# Patient Record
Sex: Female | Born: 2000 | Race: White | Hispanic: No | Marital: Single | Smoking: Never smoker
Health system: Southern US, Community
[De-identification: ages and names within clinical notes are randomized; demographics above are authoritative.]

## PROBLEM LIST (undated history)

## (undated) DIAGNOSIS — G43909 Migraine, unspecified, not intractable, without status migrainosus: Secondary | ICD-10-CM

## (undated) HISTORY — PX: ANTERIOR CRUCIATE LIGAMENT REPAIR: SHX115

## (undated) HISTORY — PX: OTHER SURGICAL HISTORY: SHX169

## (undated) HISTORY — DX: Migraine, unspecified, not intractable, without status migrainosus: G43.909

---
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
## 2023-04-13 ENCOUNTER — Encounter
Admission: EM | Disposition: A | Discharge status: Home / Self Care | Payer: Self-pay | Source: Home / Self Care | Attending: Emergency Medicine

## 2023-04-13 ENCOUNTER — Other Ambulatory Visit: Payer: Self-pay

## 2023-04-13 ENCOUNTER — Emergency Department: Payer: BC Managed Care – PPO

## 2023-04-13 ENCOUNTER — Observation Stay
Admission: EM | Admit: 2023-04-13 | Discharge: 2023-04-14 | Disposition: A | Discharge status: Home / Self Care | Payer: BC Managed Care – PPO | Attending: General Surgery | Admitting: General Surgery

## 2023-04-13 ENCOUNTER — Observation Stay: Payer: BC Managed Care – PPO | Admitting: Anesthesiology

## 2023-04-13 DIAGNOSIS — K353 Acute appendicitis with localized peritonitis, without perforation or gangrene: Principal | ICD-10-CM | POA: Insufficient documentation

## 2023-04-13 DIAGNOSIS — R1011 Right upper quadrant pain: Secondary | ICD-10-CM | POA: Diagnosis present

## 2023-04-13 HISTORY — PX: XI ROBOTIC LAPAROSCOPIC ASSISTED APPENDECTOMY: SHX6877

## 2023-04-13 LAB — COMPREHENSIVE METABOLIC PANEL
ALT: 72 U/L — ABNORMAL HIGH (ref 0–44)
AST: 95 U/L — ABNORMAL HIGH (ref 15–41)
Albumin: 3.9 g/dL (ref 3.5–5.0)
Alkaline Phosphatase: 34 U/L — ABNORMAL LOW (ref 38–126)
Anion gap: 8 (ref 5–15)
BUN: 11 mg/dL (ref 6–20)
CO2: 25 mmol/L (ref 22–32)
Calcium: 9.2 mg/dL (ref 8.9–10.3)
Chloride: 105 mmol/L (ref 98–111)
Creatinine, Ser: 0.7 mg/dL (ref 0.44–1.00)
GFR, Estimated: >60 mL/min (ref >60)
Glucose, Bld: 108 mg/dL — ABNORMAL HIGH (ref 70–99)
Potassium: 3.5 mmol/L (ref 3.5–5.1)
Sodium: 138 mmol/L (ref 135–145)
Total Bilirubin: 0.5 mg/dL (ref 0.3–1.2)
Total Protein: 6.8 g/dL (ref 6.5–8.1)

## 2023-04-13 LAB — CBC
HCT: 36.4 % (ref 36.0–46.0)
Hemoglobin: 12.3 g/dL (ref 12.0–15.0)
MCH: 28 pg (ref 26.0–34.0)
MCHC: 33.8 g/dL (ref 30.0–36.0)
MCV: 82.9 fL (ref 80.0–100.0)
Platelets: 262 10*3/uL (ref 150–400)
RBC: 4.39 MIL/uL (ref 3.87–5.11)
RDW: 12 % (ref 11.5–15.5)
WBC: 10.5 10*3/uL (ref 4.0–10.5)
nRBC: 0 % (ref 0.0–0.2)

## 2023-04-13 LAB — URINALYSIS, ROUTINE W REFLEX MICROSCOPIC
Bilirubin Urine: NEGATIVE
Glucose, UA: NEGATIVE mg/dL
Hgb urine dipstick: NEGATIVE
Ketones, ur: NEGATIVE mg/dL
Leukocytes,Ua: NEGATIVE
Nitrite: NEGATIVE
Protein, ur: NEGATIVE mg/dL
Specific Gravity, Urine: 1.02 (ref 1.005–1.030)
pH: 7 (ref 5.0–8.0)

## 2023-04-13 LAB — LIPASE, BLOOD: Lipase: 27 U/L (ref 11–51)

## 2023-04-13 LAB — POC URINE PREG, ED: Preg Test, Ur: NEGATIVE

## 2023-04-13 SURGERY — APPENDECTOMY, ROBOT-ASSISTED, LAPAROSCOPIC
Anesthesia: General | Site: Abdomen

## 2023-04-13 MED ORDER — DEXMEDETOMIDINE HCL IN NACL 80 MCG/20ML IV SOLN
INTRAVENOUS | Status: DC | PRN
  Administered 2023-04-13: 20 ug via INTRAVENOUS

## 2023-04-13 MED ORDER — DEXAMETHASONE SODIUM PHOSPHATE 10 MG/ML IJ SOLN
INTRAMUSCULAR | Status: AC
  Filled 2023-04-13: qty 1

## 2023-04-13 MED ORDER — SODIUM CHLORIDE 0.9 % IV BOLUS
1000.0000 mL | Freq: Once | INTRAVENOUS | Status: AC
  Administered 2023-04-13: 1000 mL via INTRAVENOUS

## 2023-04-13 MED ORDER — BUPIVACAINE HCL (PF) 0.5 % IJ SOLN
INTRAMUSCULAR | Status: AC
  Filled 2023-04-13: qty 30

## 2023-04-13 MED ORDER — OXYCODONE HCL 5 MG/5ML PO SOLN: 5.0000 mg | Freq: Once | ORAL | Status: DC | PRN

## 2023-04-13 MED ORDER — BUPIVACAINE-EPINEPHRINE (PF) 0.5% -1:200000 IJ SOLN
INTRAMUSCULAR | Status: DC | PRN
  Administered 2023-04-13: 20 mL
  Administered 2023-04-13: 10 mL

## 2023-04-13 MED ORDER — ENOXAPARIN SODIUM 40 MG/0.4ML IJ SOSY: 40.0000 mg | PREFILLED_SYRINGE | INTRAMUSCULAR | Status: DC

## 2023-04-13 MED ORDER — SUGAMMADEX SODIUM 500 MG/5ML IV SOLN
INTRAVENOUS | Status: DC | PRN
  Administered 2023-04-13: 200 mg via INTRAVENOUS

## 2023-04-13 MED ORDER — OXYCODONE HCL 5 MG PO TABS: 5.0000 mg | ORAL_TABLET | Freq: Once | ORAL | Status: DC | PRN

## 2023-04-13 MED ORDER — MIDAZOLAM HCL 2 MG/2ML IJ SOLN
INTRAMUSCULAR | Status: DC | PRN
  Administered 2023-04-13 (×2): 1 mg via INTRAVENOUS

## 2023-04-13 MED ORDER — MIDAZOLAM HCL 2 MG/2ML IJ SOLN
INTRAMUSCULAR | Status: AC
  Filled 2023-04-13: qty 2

## 2023-04-13 MED ORDER — PROPOFOL 10 MG/ML IV BOLUS
INTRAVENOUS | Status: DC | PRN
  Administered 2023-04-13: 200 mg via INTRAVENOUS

## 2023-04-13 MED ORDER — LIDOCAINE HCL (CARDIAC) PF 100 MG/5ML IV SOSY
PREFILLED_SYRINGE | INTRAVENOUS | Status: DC | PRN
  Administered 2023-04-13: 40 mg via INTRAVENOUS

## 2023-04-13 MED ORDER — PHENYLEPHRINE 80 MCG/ML (10ML) SYRINGE FOR IV PUSH (FOR BLOOD PRESSURE SUPPORT)
PREFILLED_SYRINGE | INTRAVENOUS | Status: AC
  Filled 2023-04-13: qty 10

## 2023-04-13 MED ORDER — FENTANYL CITRATE (PF) 100 MCG/2ML IJ SOLN
INTRAMUSCULAR | Status: AC
  Filled 2023-04-13: qty 2

## 2023-04-13 MED ORDER — ACETAMINOPHEN 10 MG/ML IV SOLN: 1000.0000 mg | Freq: Once | INTRAVENOUS | Status: DC | PRN

## 2023-04-13 MED ORDER — ACETAMINOPHEN 650 MG RE SUPP: 650.0000 mg | Freq: Four times a day (QID) | RECTAL | Status: DC | PRN

## 2023-04-13 MED ORDER — SUCCINYLCHOLINE CHLORIDE 200 MG/10ML IV SOSY
PREFILLED_SYRINGE | INTRAVENOUS | Status: AC
  Filled 2023-04-13: qty 10

## 2023-04-13 MED ORDER — ACETAMINOPHEN 10 MG/ML IV SOLN
INTRAVENOUS | Status: DC | PRN
  Administered 2023-04-13: 1000 mg via INTRAVENOUS

## 2023-04-13 MED ORDER — ONDANSETRON HCL 4 MG/2ML IJ SOLN: 4.0000 mg | Freq: Four times a day (QID) | INTRAMUSCULAR | Status: DC | PRN

## 2023-04-13 MED ORDER — LIDOCAINE HCL (PF) 2 % IJ SOLN
INTRAMUSCULAR | Status: AC
  Filled 2023-04-13: qty 5

## 2023-04-13 MED ORDER — DEXMEDETOMIDINE HCL IN NACL 80 MCG/20ML IV SOLN
INTRAVENOUS | Status: AC
  Filled 2023-04-13: qty 20

## 2023-04-13 MED ORDER — SODIUM CHLORIDE 0.9 % IV SOLN
2.0000 g | Freq: Once | INTRAVENOUS | Status: AC
  Administered 2023-04-13: 2 g via INTRAVENOUS
  Filled 2023-04-13: qty 20

## 2023-04-13 MED ORDER — ROCURONIUM BROMIDE 10 MG/ML (PF) SYRINGE
PREFILLED_SYRINGE | INTRAVENOUS | Status: AC
  Filled 2023-04-13: qty 10

## 2023-04-13 MED ORDER — PIPERACILLIN-TAZOBACTAM 3.375 G IVPB
3.3750 g | Freq: Three times a day (TID) | INTRAVENOUS | Status: DC
  Administered 2023-04-13: 3.375 g via INTRAVENOUS
  Filled 2023-04-13: qty 50

## 2023-04-13 MED ORDER — MORPHINE SULFATE (PF) 4 MG/ML IV SOLN: 4.0000 mg | INTRAVENOUS | Status: DC | PRN

## 2023-04-13 MED ORDER — SUCCINYLCHOLINE CHLORIDE 200 MG/10ML IV SOSY
PREFILLED_SYRINGE | INTRAVENOUS | Status: DC | PRN
  Administered 2023-04-13: 80 mg via INTRAVENOUS

## 2023-04-13 MED ORDER — ACETAMINOPHEN 325 MG PO TABS: 650.0000 mg | ORAL_TABLET | Freq: Four times a day (QID) | ORAL | Status: DC | PRN

## 2023-04-13 MED ORDER — DEXAMETHASONE SODIUM PHOSPHATE 10 MG/ML IJ SOLN
INTRAMUSCULAR | Status: DC | PRN
  Administered 2023-04-13: 6 mg via INTRAVENOUS

## 2023-04-13 MED ORDER — PROMETHAZINE HCL 25 MG/ML IJ SOLN: 6.2500 mg | INTRAMUSCULAR | Status: DC | PRN

## 2023-04-13 MED ORDER — ACETAMINOPHEN 10 MG/ML IV SOLN
INTRAVENOUS | Status: AC
  Filled 2023-04-13: qty 100

## 2023-04-13 MED ORDER — PHENYLEPHRINE 80 MCG/ML (10ML) SYRINGE FOR IV PUSH (FOR BLOOD PRESSURE SUPPORT)
PREFILLED_SYRINGE | INTRAVENOUS | Status: DC | PRN
  Administered 2023-04-13: 80 ug via INTRAVENOUS

## 2023-04-13 MED ORDER — MORPHINE SULFATE (PF) 4 MG/ML IV SOLN
4.0000 mg | Freq: Once | INTRAVENOUS | Status: DC
  Filled 2023-04-13: qty 1

## 2023-04-13 MED ORDER — PROPOFOL 10 MG/ML IV BOLUS
INTRAVENOUS | Status: AC
  Filled 2023-04-13: qty 20

## 2023-04-13 MED ORDER — ONDANSETRON HCL 4 MG/2ML IJ SOLN
4.0000 mg | Freq: Once | INTRAMUSCULAR | Status: AC
  Administered 2023-04-13: 4 mg via INTRAVENOUS
  Filled 2023-04-13: qty 2

## 2023-04-13 MED ORDER — IOHEXOL 300 MG/ML  SOLN
100.0000 mL | Freq: Once | INTRAMUSCULAR | Status: AC | PRN
  Administered 2023-04-13: 80 mL via INTRAVENOUS

## 2023-04-13 MED ORDER — FENTANYL CITRATE (PF) 100 MCG/2ML IJ SOLN
INTRAMUSCULAR | Status: DC | PRN
  Administered 2023-04-13 (×2): 50 ug via INTRAVENOUS

## 2023-04-13 MED ORDER — METRONIDAZOLE 500 MG/100ML IV SOLN
500.0000 mg | Freq: Once | INTRAVENOUS | Status: DC
  Filled 2023-04-13: qty 100

## 2023-04-13 MED ORDER — ROCURONIUM BROMIDE 100 MG/10ML IV SOLN
INTRAVENOUS | Status: DC | PRN
  Administered 2023-04-13: 30 mg via INTRAVENOUS

## 2023-04-13 MED ORDER — 0.9 % SODIUM CHLORIDE (POUR BTL) OPTIME
TOPICAL | Status: DC | PRN
  Administered 2023-04-13: 500 mL

## 2023-04-13 MED ORDER — ONDANSETRON 4 MG PO TBDP: 4.0000 mg | ORAL_TABLET | Freq: Four times a day (QID) | ORAL | Status: DC | PRN

## 2023-04-13 MED ORDER — FENTANYL CITRATE (PF) 100 MCG/2ML IJ SOLN: 25.0000 ug | INTRAMUSCULAR | Status: DC | PRN

## 2023-04-13 MED ORDER — DROPERIDOL 2.5 MG/ML IJ SOLN: 0.6250 mg | Freq: Once | INTRAMUSCULAR | Status: DC | PRN

## 2023-04-13 MED ORDER — LACTATED RINGERS IV SOLN: INTRAVENOUS | Status: DC | PRN

## 2023-04-13 MED ORDER — EPINEPHRINE PF 1 MG/ML IJ SOLN
INTRAMUSCULAR | Status: AC
  Filled 2023-04-13: qty 1

## 2023-04-13 MED ORDER — HYDROCODONE-ACETAMINOPHEN 5-325 MG PO TABS
1.0000 | ORAL_TABLET | ORAL | Status: DC | PRN
  Administered 2023-04-13 – 2023-04-14 (×2): 2 via ORAL
  Filled 2023-04-13 (×2): qty 2

## 2023-04-13 SURGICAL SUPPLY — 60 items
ADH SKN CLS APL DERMABOND .7 (GAUZE/BANDAGES/DRESSINGS) ×1
BAG PRESSURE INF REUSE 1000 (BAG) IMPLANT
BLADE SURG SZ11 CARB STEEL (BLADE) ×1 IMPLANT
CANNULA REDUCER 12-8 DVNC XI (CANNULA) ×1 IMPLANT
COVER TIP SHEARS 8 DVNC (MISCELLANEOUS) ×1 IMPLANT
DERMABOND ADVANCED .7 DNX12 (GAUZE/BANDAGES/DRESSINGS) ×1 IMPLANT
DRAPE ARM DVNC X/XI (DISPOSABLE) ×4 IMPLANT
DRAPE COLUMN DVNC XI (DISPOSABLE) ×1 IMPLANT
ELECT REM PT RETURN 9FT ADLT (ELECTROSURGICAL) ×1
ELECTRODE REM PT RTRN 9FT ADLT (ELECTROSURGICAL) ×1 IMPLANT
FORCEPS BPLR FENES DVNC XI (FORCEP) ×1 IMPLANT
GLOVE BIOGEL PI IND STRL 6.5 (GLOVE) ×2 IMPLANT
GLOVE SURG SYN 6.5 ES PF (GLOVE) ×2 IMPLANT
GLOVE SURG SYN 6.5 PF PI (GLOVE) ×2 IMPLANT
GOWN STRL REUS W/ TWL LRG LVL3 (GOWN DISPOSABLE) ×3 IMPLANT
GOWN STRL REUS W/TWL LRG LVL3 (GOWN DISPOSABLE) ×3
GRASPER SUT TROCAR 14GX15 (MISCELLANEOUS) IMPLANT
GRASPER TIP-UP FEN DVNC XI (INSTRUMENTS) ×1 IMPLANT
IRRIGATOR SUCT 8 DISP DVNC XI (IRRIGATION / IRRIGATOR) IMPLANT
IV NS 1000ML (IV SOLUTION)
IV NS 1000ML BAXH (IV SOLUTION) IMPLANT
KIT PINK PAD W/HEAD ARE REST (MISCELLANEOUS) ×1 IMPLANT
KIT PINK PAD W/HEAD ARM REST (MISCELLANEOUS) ×1 IMPLANT
LABEL OR SOLS (LABEL) IMPLANT
MANIFOLD NEPTUNE II (INSTRUMENTS) ×1 IMPLANT
NDL DRIVE SUT CUT DVNC (INSTRUMENTS) ×1 IMPLANT
NDL HYPO 22X1.5 SAFETY MO (MISCELLANEOUS) ×1 IMPLANT
NDL INSUFFLATION 14GA 120MM (NEEDLE) ×1 IMPLANT
NEEDLE DRIVE SUT CUT DVNC (INSTRUMENTS) ×1 IMPLANT
NEEDLE HYPO 22X1.5 SAFETY MO (MISCELLANEOUS) ×1 IMPLANT
NEEDLE INSUFFLATION 14GA 120MM (NEEDLE) ×1 IMPLANT
OBTURATOR OPTICAL STND 8 DVNC (TROCAR) ×1
OBTURATOR OPTICALSTD 8 DVNC (TROCAR) ×1 IMPLANT
PACK LAP CHOLECYSTECTOMY (MISCELLANEOUS) ×1 IMPLANT
RELOAD STAPLE 45 2.5 WHT DVNC (STAPLE) IMPLANT
RELOAD STAPLE 45 3.5 BLU DVNC (STAPLE) IMPLANT
RELOAD STAPLER 2.5X45 WHT DVNC (STAPLE) IMPLANT
RELOAD STAPLER 3.5X45 BLU DVNC (STAPLE) IMPLANT
SCISSORS MNPLR CVD DVNC XI (INSTRUMENTS) ×1 IMPLANT
SEAL UNIV 5-12 XI (MISCELLANEOUS) ×4 IMPLANT
SEALER VESSEL EXT DVNC XI (MISCELLANEOUS) IMPLANT
SET TUBE SMOKE EVAC HIGH FLOW (TUBING) ×1 IMPLANT
SOL ELECTROSURG ANTI STICK (MISCELLANEOUS) ×1
SOLUTION ELECTROSURG ANTI STCK (MISCELLANEOUS) ×1 IMPLANT
SPONGE T-LAP 4X18 ~~LOC~~+RFID (SPONGE) ×1 IMPLANT
STAPLER 45 SUREFORM DVNC (STAPLE) IMPLANT
STAPLER RELOAD 2.5X45 WHT DVNC (STAPLE)
STAPLER RELOAD 3.5X45 BLU DVNC (STAPLE)
SUT MNCRL AB 4-0 PS2 18 (SUTURE) ×1 IMPLANT
SUT VIC AB 2-0 SH 27 (SUTURE) ×1
SUT VIC AB 2-0 SH 27XBRD (SUTURE) ×1 IMPLANT
SUT VIC AB 2-0 UR6 27 (SUTURE) IMPLANT
SUT VICRYL 0 UR6 27IN ABS (SUTURE) ×1 IMPLANT
SUT VLOC 90 6 CV-15 VIOLET (SUTURE) ×1 IMPLANT
SYR 30ML LL (SYRINGE) ×1 IMPLANT
SYS BAG RETRIEVAL 10MM (BASKET) ×1
SYSTEM BAG RETRIEVAL 10MM (BASKET) ×1 IMPLANT
TRAP FLUID SMOKE EVACUATOR (MISCELLANEOUS) ×1 IMPLANT
TRAY FOLEY MTR SLVR 16FR STAT (SET/KITS/TRAYS/PACK) IMPLANT
WATER STERILE IRR 500ML POUR (IV SOLUTION) ×1 IMPLANT

## 2023-04-13 NOTE — Anesthesia Preprocedure Evaluation (Signed)
Anesthesia Evaluation  Patient identified by MRN, date of birth, ID band Patient awake    Reviewed: Allergy & Precautions, H&P , NPO status , Patient's Chart, lab work & pertinent test results, reviewed documented beta blocker date and time   Airway Mallampati: II  TM Distance: >3 FB Neck ROM: full    Dental  (+) Teeth Intact   Pulmonary neg pulmonary ROS   Pulmonary exam normal        Cardiovascular Exercise Tolerance: Good negative cardio ROS Normal cardiovascular exam Rhythm:regular Rate:Normal     Neuro/Psych  Headaches  negative psych ROS   GI/Hepatic negative GI ROS, Neg liver ROS,,,  Endo/Other  negative endocrine ROS    Renal/GU negative Renal ROS  negative genitourinary   Musculoskeletal   Abdominal   Peds  Hematology negative hematology ROS (+)   Anesthesia Other Findings Past Medical History: No date: Migraine Past Surgical History: No date: ANTERIOR CRUCIATE LIGAMENT REPAIR No date: cat scratch disease BMI    Body Mass Index: 19.53 kg/m     Reproductive/Obstetrics negative OB ROS                             Anesthesia Physical Anesthesia Plan  ASA: 2 and emergent  Anesthesia Plan: General ETT   Post-op Pain Management:    Induction:   PONV Risk Score and Plan: 4 or greater  Airway Management Planned:   Additional Equipment:   Intra-op Plan:   Post-operative Plan:   Informed Consent: I have reviewed the patients History and Physical, chart, labs and discussed the procedure including the risks, benefits and alternatives for the proposed anesthesia with the patient or authorized representative who has indicated his/her understanding and acceptance.     Dental Advisory Given  Plan Discussed with: CRNA  Anesthesia Plan Comments:        Anesthesia Quick Evaluation

## 2023-04-13 NOTE — Transfer of Care (Signed)
Immediate Anesthesia Transfer of Care Note  Patient: Courtney Perez  Procedure(s) Performed: XI ROBOTIC LAPAROSCOPIC ASSISTED APPENDECTOMY (Abdomen)  Patient Location: PACU  Anesthesia Type:General  Level of Consciousness: drowsy and patient cooperative  Airway & Oxygen Therapy: Patient Spontanous Breathing  Post-op Assessment: Report given to RN and Post -op Vital signs reviewed and stable  Post vital signs: Reviewed and stable  Last Vitals:  Vitals Value Taken Time  BP 96/63 04/13/23 1817  Temp    Pulse 92 04/13/23 1819  Resp 16 04/13/23 1819  SpO2 100 % 04/13/23 1819  Vitals shown include unvalidated device data.  Last Pain:  Vitals:   04/13/23 1628  TempSrc: Temporal  PainSc: 8       Patients Stated Pain Goal: 0 (04/13/23 1628)  Complications: No notable events documented.

## 2023-04-13 NOTE — Anesthesia Procedure Notes (Addendum)
Procedure Name: Intubation Date/Time: 04/13/2023 4:51 PM  Performed by: Jeannene Patella, CRNAPre-anesthesia Checklist: Patient identified, Emergency Drugs available, Suction available, Patient being monitored and Timeout performed Patient Re-evaluated:Patient Re-evaluated prior to induction Oxygen Delivery Method: Circle system utilized Preoxygenation: Pre-oxygenation with 100% oxygen Induction Type: IV induction, Rapid sequence and Cricoid Pressure applied Laryngoscope Size: McGraph and 3 Grade View: Grade II Tube type: Oral Tube size: 6.5 mm Number of attempts: 1 Airway Equipment and Method: Stylet, Video-laryngoscopy and LTA kit utilized Placement Confirmation: ETT inserted through vocal cords under direct vision and positive ETCO2 Secured at: 20 (at lip) cm Tube secured with: Tape Dental Injury: Teeth and Oropharynx as per pre-operative assessment  Comments: No secretions noted in vc or posterior pharynx

## 2023-04-13 NOTE — ED Provider Notes (Signed)
[facility] Provider Note    Event Date/Time   First MD Initiated Contact with Patient 04/13/23 1243     (approximate)   History   Abdominal Pain   HPI  Courtney Perez is a 22 y.o. female  here with abdominal pain. Pt reports progressively worsening epigastric and r sided abdominal pain. Began gradually and has persistently worsened since onset. It seems to worsen w/ eating. No alleviating factors. No change in bowel habits. No h/o similar issues. No h/o ulcers or GERD. No recent medication changes. Denies any regular NSAID use.       Physical Exam   Triage Vital Signs: ED Triage Vitals  Enc Vitals Group     BP 04/13/23 1239 108/72     Pulse Rate 04/13/23 1239 89     Resp 04/13/23 1239 18     Temp 04/13/23 1239 98.1 F (36.7 C)     Temp src --      SpO2 04/13/23 1239 96 %     Weight 04/13/23 1240 100 lb (45.4 kg)     Height 04/13/23 1240 5' (1.524 m)     Head Circumference --      Peak Flow --      Pain Score 04/13/23 1239 8     Pain Loc --      Pain Edu? --      Excl. in GC? --     Most recent vital signs: Vitals:   04/13/23 1845 04/13/23 1901  BP: 102/67 105/82  Pulse: 69 70  Resp: 13 18  Temp: (!) 97.5 F (36.4 C) 98 F (36.7 C)  SpO2: 100% 100%     General: Awake, no distress.  CV:  Good peripheral perfusion.  Resp:  Normal work of breathing.  Abd:  No distention. Moderate RUQ, RLQ and epigastric TTP with slight rebound.  Other:  MMM.   ED Results / Procedures / Treatments   Labs (all labs ordered are listed, but only abnormal results are displayed) Labs Reviewed  COMPREHENSIVE METABOLIC PANEL - Abnormal; Notable for the following components:      Result Value   Glucose, Bld 108 (*)    AST 95 (*)    ALT 72 (*)    Alkaline Phosphatase 34 (*)    All other components within normal limits  URINALYSIS, ROUTINE W REFLEX MICROSCOPIC - Abnormal; Notable for the following components:   Color, Urine YELLOW (*)     APPearance TURBID (*)    Bacteria, UA FEW (*)    All other components within normal limits  LIPASE, BLOOD  CBC  POC URINE PREG, ED  SURGICAL PATHOLOGY     EKG    RADIOLOGY CT A/P: Enlarged appendix with adjacent stranding, wall enhancement and appendicolith   I also independently reviewed and agree with radiologist interpretations.   PROCEDURES:  Critical Care performed: No   MEDICATIONS ORDERED IN ED: Medications  morphine (PF) 4 MG/ML injection 4 mg ( Intravenous MAR Unhold 04/13/23 1856)  acetaminophen (TYLENOL) tablet 650 mg (has no administration in time range)    Or  acetaminophen (TYLENOL) suppository 650 mg (has no administration in time range)  HYDROcodone-acetaminophen (NORCO/VICODIN) 5-325 MG per tablet 1-2 tablet (has no administration in time range)  morphine (PF) 4 MG/ML injection 4 mg (has no administration in time range)  ondansetron (ZOFRAN-ODT) disintegrating tablet 4 mg (has no administration in time range)    Or  ondansetron (ZOFRAN) injection 4 mg (has no administration in  time range)  ondansetron (ZOFRAN) injection 4 mg (4 mg Intravenous Given 04/13/23 1315)  sodium chloride 0.9 % bolus 1,000 mL (0 mLs Intravenous Stopped 04/13/23 1402)  iohexol (OMNIPAQUE) 300 MG/ML solution 100 mL (80 mLs Intravenous Contrast Given 04/13/23 1355)  cefTRIAXone (ROCEPHIN) 2 g in sodium chloride 0.9 % 100 mL IVPB (0 g Intravenous Stopped 04/13/23 1442)     IMPRESSION / MDM / ASSESSMENT AND PLAN / ED COURSE  I reviewed the triage vital signs and the nursing notes.                              Differential diagnosis includes, but is not limited to, appendicitis, cholecystitis, enteritis, colitis, obstruction, ovarian cyst, UTI, pyelo  Patient's presentation is most consistent with acute presentation with potential threat to life or bodily function.  The patient is on the cardiac monitor to evaluate for evidence of arrhythmia and/or significant heart rate changes  22 yo  F here with epigastric/RUQ abdominal pain, though with significant RLQ TTP on exam as well. Afebrile and HDS.  CT scan obtained, reviewed, shows biliary ductal dilatation as well as likely acute appendicitis. Pt does have RUQ TTP and elevated LFTs. Clinically, suspect acute appendicitis primarily, but will obtain U/S to rule out alternative biliary abnormalities. Pt made NPO and started on IVF, abx.    FINAL CLINICAL IMPRESSION(S) / ED DIAGNOSES   Final diagnoses:  Acute appendicitis with localized peritonitis, without perforation, abscess, or gangrene     Rx / DC Orders   ED Discharge Orders     None        Note:  This document was prepared using Dragon voice recognition software and may include unintentional dictation errors.   Shaune Pollack, MD 04/13/23 2003

## 2023-04-13 NOTE — ED Notes (Signed)
Earring given to charge nurse Erie Noe.

## 2023-04-13 NOTE — ED Notes (Signed)
Report given to OR.

## 2023-04-13 NOTE — ED Notes (Signed)
Patient transported to ED from Ultrasound

## 2023-04-13 NOTE — Op Note (Signed)
Pre-op Diagnosis: Acute appendicitis   Post op Diagnosis: Acute appenditicis  Procedure: Robotic assisted laparoscopic appendectomy.  Anesthesia: GETA  Surgeon: Carolan Shiver, MD, FACS  Wound Classification: clean contaminated  Specimen: Appendix  Complications: None  Estimated Blood Loss: 5 mL   Indications: Patient is a 22 y.o. female  presented with above right lower quadrant pain. CT scan shows acute appendicitis.     FIndings: 1.  Irritated appendix with appendicolith 2. No peri-appendiceal abscess or phlegmon 3. Normal anatomy 4. Adequate hemostasis.   Description of procedure: The patient was placed on the operating table in the supine position. General anesthesia was induced. A time-out was completed verifying correct patient, procedure, site, positioning, and implant(s) and/or special equipment prior to beginning this procedure. The abdomen was prepped and draped in the usual sterile fashion.   Veress needle was inserted in supraumbilical area.  After confirming 2 clicks and a positive saline drop test, gas insufflation was initiated until the abdominal pressure was measured at 15 mmHg.  Afterwards, the Veress needle was removed and a 8 mm port was placed in left upper quadrant area using Optiview technique.  After local was infused, 2 additional incision on the left abdominal wall were made 5 cm apart.  Two other 8 mm ports were placed under direct visualization.  No injuries from trocar placements were noted.  The table was placed in the Trendelenburg position with the right side elevated.  Force bipolar and monopolar scissors, an inflamed appendix was identified and elevated.  Window created at base of appendix in the mesentery.    The mesoappendix was divided with combination of bipolar energy and monopolar scissors.  The base of the appendix was ligated with 3-0 V-Loc.  The appendix was divided with monopolar scissors.  A second layer of the 3 oh V-Loc was done  over the appendiceal stump.  The appendiceal stump was examined and hemostasis noted. No other pathology was identified within pelvis. Remaining trocars were removed under direct vision. No bleeding was noted.The abdomen was allowed to collapse.  All skin incisions then closed with subcuticular sutures Monocryl 4-0.  Wounds then dressed with dermabond.  The patient tolerated the procedure well, awakened from anesthesia and was taken to the postanesthesia care unit in satisfactory condition.  Sponge count and instrument count correct at the end of the procedure.

## 2023-04-13 NOTE — ED Triage Notes (Signed)
Pt to ED for upper abd pain started yesterday. +nausea. Pt appears uncomfortable

## 2023-04-13 NOTE — ED Notes (Signed)
Patient transported to Ultrasound 

## 2023-04-13 NOTE — ED Notes (Signed)
Called mother and left voicemail to let her know gold hoop earring was found in patient's room.

## 2023-04-13 NOTE — ED Notes (Signed)
Last po intake 0800

## 2023-04-13 NOTE — H&P (Signed)
SURGICAL CONSULTATION NOTE   HISTORY OF PRESENT ILLNESS (HPI):  22 y.o. female presented to [facility] ED for evaluation of abdominal pain. Patient reports she started having abdominal pain last night.  Pain in the mid abdomen that then radiates to the upper abdomen and now in the right upper quadrant.  Patient cannot identify any elevating factors.  Aggravating factors is applying pressure in the right upper quadrant.  Denies any nausea or vomiting.  At the ED he was found with white blood cell count of 10.5.  No fever.  She had a CT scan of the abdomen and pelvis that shows dilated appendix with appendicolith.  Physical exam with tenderness to palpation in the right lower quadrant.  I personally evaluated the images of the CT scan of the abdomen.  Surgery is consulted by Dr. Erma Heritage in this context for evaluation and management of acute appendicitis.  PAST MEDICAL HISTORY (PMH):  Past Medical History:  Diagnosis Date   Migraine      PAST SURGICAL HISTORY (PSH):  Past Surgical History:  Procedure Laterality Date   ANTERIOR CRUCIATE LIGAMENT REPAIR     cat scratch disease       MEDICATIONS:  Medication history reviewed.  No pertinent medications.  ALLERGIES:  No Known Allergies   SOCIAL HISTORY:  Social History   Socioeconomic History   Marital status: Single    Spouse name: Not on file   Number of children: Not on file   Years of education: Not on file   Highest education level: Not on file  Occupational History   Not on file  Tobacco Use   Smoking status: Never   Smokeless tobacco: Never  Vaping Use   Vaping Use: Never used  Substance and Sexual Activity   Alcohol use: Not Currently   Drug use: Not Currently   Sexual activity: Not on file  Other Topics Concern   Not on file  Social History Narrative   Not on file   Social Determinants of Health   Financial Resource Strain: Not on file  Food Insecurity: Not on file  Transportation Needs: Not on file  Physical  Activity: Not on file  Stress: Not on file  Social Connections: Not on file  Intimate Partner Violence: Not on file      FAMILY HISTORY:  History reviewed. No pertinent family history.   REVIEW OF SYSTEMS:  Constitutional: denies weight loss, fever, chills, or sweats  Eyes: denies any other vision changes, history of eye injury  ENT: denies sore throat, hearing problems  Respiratory: denies shortness of breath, wheezing  Cardiovascular: denies chest pain, palpitations  Gastrointestinal: positive abdominal pain,  Genitourinary: denies burning with urination or urinary frequency Musculoskeletal: denies any other joint pains or cramps  Skin: denies any other rashes or skin discolorations  Neurological: denies any other headache, dizziness, weakness  Psychiatric: denies any other depression, anxiety   All other review of systems were negative   VITAL SIGNS:  Temp:  [98.1 F (36.7 C)] 98.1 F (36.7 C) (07/01 1239) Pulse Rate:  [70-90] 70 (07/01 1330) Resp:  [18] 18 (07/01 1239) BP: (95-114)/(71-80) 95/80 (07/01 1330) SpO2:  [96 %-100 %] 100 % (07/01 1330) Weight:  [45.4 kg] 45.4 kg (07/01 1240)     Height: 5' (152.4 cm) Weight: 45.4 kg BMI (Calculated): 19.53   INTAKE/OUTPUT:  This shift: Total I/O In: 1100 [IV Piggyback:1100] Out: -   Last 2 shifts: @IOLAST2SHIFTS @   PHYSICAL EXAM:  Constitutional:  -- Normal body habitus  --  Awake, alert, and oriented x3  Eyes:  -- Pupils equally round and reactive to light  -- No scleral icterus  Ear, nose, and throat:  -- No jugular venous distension  Pulmonary:  -- No crackles  -- Equal breath sounds bilaterally -- Breathing non-labored at rest Cardiovascular:  -- S1, S2 present  -- No pericardial rubs Gastrointestinal:  -- Abdomen soft, tender in the right lower quadrant, non-distended, no guarding or rebound tenderness -- No abdominal masses appreciated, pulsatile or otherwise  Musculoskeletal and Integumentary:  --  Wounds or skin discoloration: None appreciated -- Extremities: B/L UE and LE FROM, hands and feet warm, no edema  Neurologic:  -- Motor function: intact and symmetric -- Sensation: intact and symmetric   Labs:     Latest Ref Rng & Units 04/13/2023   12:44 PM 07/26/2009   11:20 PM 07/26/2009    9:53 AM  CBC  WBC 4.0 - 10.5 K/uL 10.5  7.1    Hemoglobin 12.0 - 15.0 g/dL 16.1  09.6  04.5   Hematocrit 36.0 - 46.0 % 36.4  40.6  42.0   Platelets 150 - 400 K/uL 262  247        Latest Ref Rng & Units 04/13/2023   12:44 PM 07/26/2009    9:53 AM  CMP  Glucose 70 - 99 mg/dL 409  95   BUN 6 - 20 mg/dL 11  6   Creatinine 8.11 - 1.00 mg/dL 9.14  0.4   Sodium 782 - 145 mmol/L 138  136   Potassium 3.5 - 5.1 mmol/L 3.5  4.1   Chloride 98 - 111 mmol/L 105  101   CO2 22 - 32 mmol/L 25    Calcium 8.9 - 10.3 mg/dL 9.2    Total Protein 6.5 - 8.1 g/dL 6.8    Total Bilirubin 0.3 - 1.2 mg/dL 0.5    Alkaline Phos 38 - 126 U/L 34    AST 15 - 41 U/L 95    ALT 0 - 44 U/L 72      Imaging studies:  EXAM: CT ABDOMEN AND PELVIS WITH CONTRAST   TECHNIQUE: Multidetector CT imaging of the abdomen and pelvis was performed using the standard protocol following bolus administration of intravenous contrast.   RADIATION DOSE REDUCTION: This exam was performed according to the departmental dose-optimization program which includes automated exposure control, adjustment of the mA and/or kV according to patient size and/or use of iterative reconstruction technique.   CONTRAST:  80mL OMNIPAQUE IOHEXOL 300 MG/ML  SOLN   COMPARISON:  None Available.   FINDINGS: Lower chest: Lung bases are grossly clear.  No pleural effusion.   Hepatobiliary: Preserved hepatic parenchyma. Gallbladder is nondilated. Patent portal vein. There is some periportal edema, nonspecific.   Pancreas: Unremarkable. No pancreatic ductal dilatation or surrounding inflammatory changes.   Spleen: Normal in size without focal  abnormality.   Adrenals/Urinary Tract: Adrenal glands are unremarkable. Kidneys are normal, without renal calculi, focal lesion, or hydronephrosis. Bladder is unremarkable.   Stomach/Bowel: Moderate debris in the stomach. The stomach is nondilated. Small bowel is nondilated on this non oral contrast exam. Large bowel has a normal course and caliber with scattered colonic stool. The appendix is seen extending medial to the cecum in the right lower quadrant. There is an appendicolith towards the midportion of the appendix. The tip has some enhancement and diameter approaches 10 mm. Slight adjacent stranding. Findings are consistent with a acute appendicitis until proven otherwise. No free air, rim enhancing  fluid collection. Trace free fluid in the pelvis.   Vascular/Lymphatic: No significant vascular findings are present. No enlarged abdominal or pelvic lymph nodes.   Reproductive: Uterus and bilateral adnexa are unremarkable.   Other: Trace free fluid in the pelvis.  No free air.   Musculoskeletal: No acute or significant osseous findings.   IMPRESSION: Enlarged appendix with the adjacent stranding, wall enhancement and appendicolith. Please correlate for clinical presentation of acute appendicitis. Of note the appendix is medial to the cecum and ascending colon in the right lower quadrant.   Trace free fluid in the pelvis.  No bowel obstruction or free air.   Nonspecific periportal edema.     Electronically Signed   By: Karen Kays M.D.   On: 04/13/2023 14:35  Assessment/Plan:  22 y.o. female with acute appendicitis.  Patient with history, physical exam and images consistent with acute appendicitis. Patient oriented about diagnosis and surgical management as treatment. Patient oriented about goals of surgery and its risk including: bowel injury, infection, abscess, bleeding, leak from cecum, intestinal adhesions, bowel obstruction, fistula, injury to the ureter among  others.  Patient understood and agreed to proceed with surgery. Will admit patient, already started on antibiotic therapy, will give IV hydration since patient is NPO and schedule to OR.   Gae Gallop, MD

## 2023-04-14 ENCOUNTER — Encounter: Payer: Self-pay | Admitting: General Surgery

## 2023-04-14 MED ORDER — HYDROCODONE-ACETAMINOPHEN 5-325 MG PO TABS: 1.0000 | ORAL_TABLET | ORAL | 0 refills | Status: AC | PRN

## 2023-04-14 NOTE — Discharge Instructions (Signed)
  Diet: Resume home heart healthy regular diet.   Activity: Increase activity as tolerated. Light activity and walking are encouraged. Do not drive or drink alcohol if taking narcotic pain medications.  Wound care: May shower with soapy water and pat dry (do not rub incisions), but no baths or submerging incision underwater until follow-up. (no swimming)   Medications: Resume all home medications. For mild to moderate pain: acetaminophen (Tylenol) or ibuprofen (if no kidney disease). Combining Tylenol with alcohol can substantially increase your risk of causing liver disease. Narcotic pain medications, if prescribed, can be used for severe pain, though may cause nausea, constipation, and drowsiness. Do not combine Tylenol and Norco within a 6 hour period as Norco contains Tylenol. If you do not need the narcotic pain medication, you do not need to fill the prescription.  Call office ([phone]) at any time if any questions, worsening pain, fevers/chills, bleeding, drainage from incision site, or other concerns.  

## 2023-04-14 NOTE — Discharge Summary (Signed)
  Patient ID: Courtney Perez MRN: [id number] DOB/AGE: 01-21-2001 22 y.o.  Admit date: 04/13/2023 Discharge date: 04/14/2023   Discharge Diagnoses:  Principal Problem:   Acute appendicitis with localized peritonitis   Procedures: Robotic assisted laparoscopic appendectomy  Hospital Course: Patient admitted with acute appendicitis.  She underwent robotic appendectomy.  She has been recovering well.  The pain is controlled.  Patient tolerating diet.  Patient ambulating.  The wounds are dry and clean.  Physical Exam Vitals reviewed.  HENT:     Head: Normocephalic.  Cardiovascular:     Rate and Rhythm: Normal rate and regular rhythm.  Pulmonary:     Effort: Pulmonary effort is normal.     Breath sounds: Normal breath sounds.  Abdominal:     General: Abdomen is flat. Bowel sounds are normal.     Palpations: Abdomen is soft.  Skin:    General: Skin is warm.  Neurological:     General: No focal deficit present.     Mental Status: She is alert and oriented to person, place, and time.      Consults: None  Disposition: Discharge disposition: 01-Home or Self Care       Discharge Instructions     Diet - low sodium heart healthy   Complete by: As directed    Increase activity slowly   Complete by: As directed       Allergies as of 04/14/2023   No Known Allergies      Medication List     TAKE these medications    HYDROcodone-acetaminophen 5-325 MG tablet Commonly known as: Norco Take 1 tablet by mouth every 4 (four) hours as needed for up to 3 days for moderate pain.        Follow-up Information     Carolan Shiver, MD Follow up in 2 week(s).   Specialty: General Surgery Why: Follow up after appendectomy Contact information: [address] [place] [address] [phone]

## 2023-04-14 NOTE — TOC CM/SW Note (Signed)
Transition of Care [facility]) - Inpatient Brief Assessment   Patient Details  Name: Courtney Perez MRN: [id number] Date of Birth: 2001-04-26  Transition of Care [facility]) CM/SW Contact:    Chapman Fitch, RN Phone Number: 04/14/2023, 9:20 AM   Clinical Narrative:    Transition of Care Asessment: Insurance and Status: Insurance coverage has been reviewed Patient has primary care physician: Yes     Prior/Current Home Services: No current home services Social Determinants of Health Reivew: SDOH reviewed no interventions necessary Readmission risk has been reviewed: Yes Transition of care needs: no transition of care needs at this time

## 2023-04-21 NOTE — Anesthesia Postprocedure Evaluation (Signed)
Anesthesia Post Note  Patient: Courtney Perez  Procedure(s) Performed: XI ROBOTIC LAPAROSCOPIC ASSISTED APPENDECTOMY (Abdomen)  Patient location during evaluation: PACU Anesthesia Type: General Level of consciousness: awake and alert Pain management: pain level controlled Vital Signs Assessment: post-procedure vital signs reviewed and stable Respiratory status: spontaneous breathing, nonlabored ventilation, respiratory function stable and patient connected to nasal cannula oxygen Cardiovascular status: blood pressure returned to baseline and stable Postop Assessment: no apparent nausea or vomiting Anesthetic complications: no   No notable events documented.   Last Vitals:  Vitals:   04/14/23 0321 04/14/23 0739  BP: 98/68 106/71  Pulse: 68 62  Resp: 16 18  Temp: 37 C 37.2 C  SpO2: 100% 100%    Last Pain:  Vitals:   04/14/23 0750  TempSrc:   PainSc: 3                  Yevette Edwards
